# Patient Record
Sex: Male | Born: 1955 | Race: Asian | Hispanic: No
Health system: Southern US, Community
[De-identification: ages and names within clinical notes are randomized; demographics above are authoritative.]

## PROBLEM LIST (undated history)

## (undated) DIAGNOSIS — I10 Essential (primary) hypertension: Secondary | ICD-10-CM

## (undated) DIAGNOSIS — E785 Hyperlipidemia, unspecified: Secondary | ICD-10-CM

## (undated) HISTORY — PX: APPENDECTOMY: SHX54

## (undated) HISTORY — DX: Essential (primary) hypertension: I10

## (undated) HISTORY — DX: Hyperlipidemia, unspecified: E78.5

---

## 2017-12-25 ENCOUNTER — Emergency Department (HOSPITAL_COMMUNITY)
Admission: EM | Admit: 2017-12-25 | Discharge: 2017-12-25 | Disposition: A | Discharge status: Home / Self Care | Payer: BLUE CROSS/BLUE SHIELD | Attending: Emergency Medicine | Admitting: Emergency Medicine

## 2017-12-25 ENCOUNTER — Emergency Department (HOSPITAL_COMMUNITY): Payer: BLUE CROSS/BLUE SHIELD

## 2017-12-25 ENCOUNTER — Other Ambulatory Visit: Payer: Self-pay

## 2017-12-25 DIAGNOSIS — R1010 Upper abdominal pain, unspecified: Secondary | ICD-10-CM | POA: Diagnosis not present

## 2017-12-25 DIAGNOSIS — R079 Chest pain, unspecified: Secondary | ICD-10-CM | POA: Diagnosis not present

## 2017-12-25 LAB — CBC
HCT: 46 % (ref 39.0–52.0)
HEMOGLOBIN: 14 g/dL (ref 13.0–17.0)
MCH: 27.7 pg (ref 26.0–34.0)
MCHC: 30.4 g/dL (ref 30.0–36.0)
MCV: 90.9 fL (ref 80.0–100.0)
NRBC: 0 % (ref 0.0–0.2)
PLATELETS: 171 10*3/uL (ref 150–400)
RBC: 5.06 MIL/uL (ref 4.22–5.81)
RDW: 13.2 % (ref 11.5–15.5)
WBC: 7 10*3/uL (ref 4.0–10.5)

## 2017-12-25 LAB — HEPATIC FUNCTION PANEL
ALBUMIN: 4.1 g/dL (ref 3.5–5.0)
ALT: 54 U/L — ABNORMAL HIGH (ref 0–44)
AST: 37 U/L (ref 15–41)
Alkaline Phosphatase: 48 U/L (ref 38–126)
BILIRUBIN TOTAL: 0.8 mg/dL (ref 0.3–1.2)
Bilirubin, Direct: 0.2 mg/dL (ref 0.0–0.2)
Indirect Bilirubin: 0.6 mg/dL (ref 0.3–0.9)
Total Protein: 7.9 g/dL (ref 6.5–8.1)

## 2017-12-25 LAB — BASIC METABOLIC PANEL
Anion gap: 7 (ref 5–15)
BUN: 15 mg/dL (ref 8–23)
CALCIUM: 9.4 mg/dL (ref 8.9–10.3)
CO2: 27 mmol/L (ref 22–32)
Chloride: 102 mmol/L (ref 98–111)
Creatinine, Ser: 0.86 mg/dL (ref 0.61–1.24)
GLUCOSE: 122 mg/dL — AB (ref 70–99)
POTASSIUM: 3.5 mmol/L (ref 3.5–5.1)
Sodium: 136 mmol/L (ref 135–145)

## 2017-12-25 LAB — I-STAT TROPONIN, ED
TROPONIN I, POC: 0 ng/mL (ref 0.00–0.08)
TROPONIN I, POC: 0 ng/mL (ref 0.00–0.08)

## 2017-12-25 LAB — LIPASE, BLOOD: LIPASE: 29 U/L (ref 11–51)

## 2017-12-25 MED ORDER — ASPIRIN 81 MG PO CHEW
324.0000 mg | CHEWABLE_TABLET | Freq: Once | ORAL | Status: AC
  Administered 2017-12-25: 324 mg via ORAL
  Filled 2017-12-25: qty 4

## 2017-12-25 MED ORDER — SODIUM CHLORIDE 0.9 % IV BOLUS
1000.0000 mL | Freq: Once | INTRAVENOUS | Status: AC
  Administered 2017-12-25: 1000 mL via INTRAVENOUS

## 2017-12-25 MED ORDER — ALUM & MAG HYDROXIDE-SIMETH 200-200-20 MG/5ML PO SUSP
30.0000 mL | Freq: Once | ORAL | Status: AC
  Administered 2017-12-25: 30 mL via ORAL
  Filled 2017-12-25: qty 30

## 2017-12-25 MED ORDER — LIDOCAINE VISCOUS HCL 2 % MT SOLN
15.0000 mL | Freq: Once | OROMUCOSAL | Status: AC
  Administered 2017-12-25: 15 mL via ORAL
  Filled 2017-12-25: qty 15

## 2017-12-25 MED ORDER — PANTOPRAZOLE SODIUM 40 MG PO TBEC: 40.0000 mg | DELAYED_RELEASE_TABLET | Freq: Every day | ORAL | 0 refills | Status: DC

## 2017-12-25 NOTE — ED Provider Notes (Signed)
[facility] EMERGENCY DEPARTMENT Provider Note   CSN: [id number] Arrival date & time: 12/25/17  1151     History   Chief Complaint Chief Complaint  Patient presents with  . Chest Pain    HPI Skylur Fuston is a 62 y.o. male.  HPI  62 year old male with a history of hypertension presents with chest pain.  History is taken with the help of the Mandarin interpreter.  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  He had shortness of breath as well.  Nothing seemed to make it better or worse.  At one point the pain was about a 6 out of 10 but currently it is minimal and hard to describe.  Later on he developed some belching and upper abdominal discomfort that he also cannot describe.  The pain never radiated.  No back pain.  He did feel dizziness that is improved but not gone.  He does not smoke.  No known family history of heart disease.  He denies any other medical problems besides hypertension. Patient points to his inferior sternum as current site of minimal pain.  No past medical history on file.  There are no active problems to display for this patient.         Home Medications    Prior to Admission medications   Medication Sig Start Date End Date Taking? Authorizing Provider  pantoprazole (PROTONIX) 40 MG tablet Take 1 tablet (40 mg total) by mouth daily. 12/25/17   Pricilla Loveless, MD    Family History No family history on file.  Social History Social History   Tobacco Use  . Smoking status: Not on file  Substance Use Topics  . Alcohol use: Not on file  . Drug use: Not on file     Allergies   Other   Review of Systems Review of Systems  Respiratory: Positive for shortness of breath.   Cardiovascular: Positive for chest pain.  Gastrointestinal: Positive for abdominal pain. Negative for vomiting.  Neurological: Positive for dizziness and headaches  (mild).  All other systems reviewed and are negative.    Physical Exam Updated Vital Signs BP (!) 152/100   Pulse 78   Temp 98.6 F (37 C)   Resp 14   SpO2 100%   Physical Exam  Constitutional: He appears well-developed and well-nourished.  Non-toxic appearance. He does not appear ill. No distress.  HENT:  Head: Normocephalic and atraumatic.  Right Ear: External ear normal.  Left Ear: External ear normal.  Nose: Nose normal.  Eyes: Right eye exhibits no discharge. Left eye exhibits no discharge.  Neck: Neck supple.  Cardiovascular: Normal rate, regular rhythm and normal heart sounds.  Pulses:      Radial pulses are 2+ on the right side, and 2+ on the left side.       Dorsalis pedis pulses are 2+ on the right side, and 2+ on the left side.  Pulmonary/Chest: Effort normal and breath sounds normal. He exhibits no tenderness.  Abdominal: Soft. There is no tenderness.  Musculoskeletal: He exhibits no edema.  Neurological: He is alert.  Skin: Skin is warm and dry. He is not diaphoretic.  Psychiatric: His mood appears not anxious.  Nursing note and vitals reviewed.    ED Treatments / Results  Labs (all labs ordered are listed, but only abnormal results are displayed) Labs Reviewed  BASIC METABOLIC  PANEL - Abnormal; Notable for the following components:      Result Value   Glucose, Bld 122 (*)    All other components within normal limits  HEPATIC FUNCTION PANEL - Abnormal; Notable for the following components:   ALT 54 (*)    All other components within normal limits  CBC  LIPASE, BLOOD  I-STAT TROPONIN, ED  I-STAT TROPONIN, ED    EKG EKG Interpretation  Date/Time:  Saturday December 25 2017 11:58:08 EDT Ventricular Rate:  76 PR Interval:  164 QRS Duration: 98 QT Interval:  364 QTC Calculation: 409 R Axis:   45 Text Interpretation:  Normal sinus rhythm no acute ST/T changes No old tracing to compare Confirmed by Pricilla Loveless [phone]) on 12/25/2017 12:39:28  PM   EKG Interpretation  Date/Time:  Saturday December 25 2017 15:32:04 EDT Ventricular Rate:  65 PR Interval:  164 QRS Duration: 106 QT Interval:  399 QTC Calculation: 415 R Axis:   76 Text Interpretation:  Sinus rhythm Minimal ST elevation, anterior leads no significant change since earlier in the day Confirmed by Pricilla Loveless [phone]) on 12/25/2017 3:58:19 PM       Radiology Dg Chest 2 View  Result Date: 12/25/2017 CLINICAL DATA:  Here today for sudden onset of centralized chest pressure, 8/10 with dizziness and shaking. Pt states he feels weak all over. No unilateral weakness. He is alert and oriented. Speech and vision are normal but he reports having tr.*comment was truncated*is EXAM: CHEST - 2 VIEW COMPARISON:  None. FINDINGS: Normal mediastinum and cardiac silhouette. Normal pulmonary vasculature. No evidence of effusion, infiltrate, or pneumothorax. No acute bony abnormality. IMPRESSION: No acute cardiopulmonary process. Electronically Signed   By: Genevive Bi M.D.   On: 12/25/2017 12:50   US Abdomen Limited Ruq  Result Date: 12/25/2017 CLINICAL DATA:  Right upper abdominal pain EXAM: ULTRASOUND ABDOMEN LIMITED RIGHT UPPER QUADRANT COMPARISON:  None. FINDINGS: Gallbladder: No gallstones or wall thickening visualized. No sonographic Murphy sign noted by sonographer. Common bile duct: Diameter: 3.3 mm Liver: No focal lesion identified. Increased hepatic parenchymal echogenicity. Portal vein is patent on color Doppler imaging with normal direction of blood flow towards the liver. IMPRESSION: 1. No cholelithiasis or sonographic evidence of acute cholecystitis. 2. Increased hepatic echogenicity as can be seen with hepatic steatosis. Electronically Signed   By: Elige Ko   On: 12/25/2017 15:10    Procedures Procedures (including critical care time)  Medications Ordered in ED Medications  sodium chloride 0.9 % bolus 1,000 mL (0 mLs Intravenous Stopped 12/25/17 1634)  alum &  mag hydroxide-simeth (MAALOX/MYLANTA) 200-200-20 MG/5ML suspension 30 mL (30 mLs Oral Given 12/25/17 1406)    And  lidocaine (XYLOCAINE) 2 % viscous mouth solution 15 mL (15 mLs Oral Given 12/25/17 1404)  aspirin chewable tablet 324 mg (324 mg Oral Given 12/25/17 1406)     Initial Impression / Assessment and Plan / ED Course  I have reviewed the triage vital signs and the nursing notes.  Pertinent labs & imaging results that were available during my care of the patient were reviewed by me and considered in my medical decision making (see chart for details).     Patient's pain is probably more abdominal than chest.  However it is in a middle zone.  My suspicion for dissection is pretty low, especially given good pulses diffusely and no hypertension on exam.  Pain has significantly improved and then resolved with Maalox.  Right upper quadrant ultrasound without obvious gallbladder issues.  Labs are reassuring.  Troponins are negative x2.  ECG without acute ischemic changes.  His heart score is a 2.  My suspicion for ACS, PE, dissection is pretty low.  I have discussed plan through the Mandarin interpreter and he will follow-up with his PCP.  We discussed that we have not fully ruled out any heart disease at all and we discussed return precautions.  Final Clinical Impressions(s) / ED Diagnoses   Final diagnoses:  Upper abdominal pain  Nonspecific chest pain    ED Discharge Orders         Ordered    pantoprazole (PROTONIX) 40 MG tablet  Daily     12/25/17 1743           Pricilla Loveless, MD 12/25/17 2140

## 2017-12-25 NOTE — ED Notes (Signed)
Patient verbalizes understanding of discharge instructions. Opportunity for questioning and answers were provided. Armband removed by staff, pt discharged from ED.  

## 2017-12-25 NOTE — Discharge Instructions (Signed)
Your pain is probably coming from a stomach acid problem.  If you develop recurrent or worsening chest pain, abdominal pain, vomiting or vomiting blood, black or bloody stools, or any other new/concerning symptoms and return to the ER for evaluation.  We are not seeing any evidence of heart attack today.  However that does not rule out all heart disease and he will need to follow-up with your primary care physician in a couple days.  ????????????????????????????????????????????????????/????????????????  ????????????????????????????????????????????????????? Nn de tngtng k?nng liz wisu?n wnt. Rgu? nn ch?xin f?nf f?zu hu ji?zhng de xi?ngtng, ftng, ?ut hu ?uxi?, dbin f? h?i hu lixu hu rnh qt? x?n de/y?ugu?n zhngzhung, q?ng f?nhu jzh?n sh jnxng pngg?.  J?nti?n w?men miy?u kn do rnh x?nzng bng de zhngj. Dnsh, zh bng b pich su?y?u x?nzng bng, t? ji?ng x?yo zi j? ti?n ni y? nn de ch?j b?ojin y?sh?ng jnxng suf?ng.

## 2017-12-25 NOTE — ED Notes (Signed)
Patient transported to Ultrasound 

## 2017-12-25 NOTE — ED Triage Notes (Signed)
[place] interpreter used. Pt has hx of HTN. Here today for sudden onset of centralized chest pressure, 8/10 with dizziness. Pt states he feels weak all over. No unilateral weakness. He is alert and oriented. Speech and vision are normal but he reports having trouble with his balance.

## 2019-03-18 IMAGING — US US ABDOMEN LIMITED
1 series · 14 of 25 positions shown · non-contrast
Comparison: None.

CLINICAL DATA: Right upper abdominal pain

EXAM:
ULTRASOUND ABDOMEN LIMITED RIGHT UPPER QUADRANT

[Series 1: us abdomen limited · 0.22mm/px · 14 of 27 slices shown]
[im 1/27]
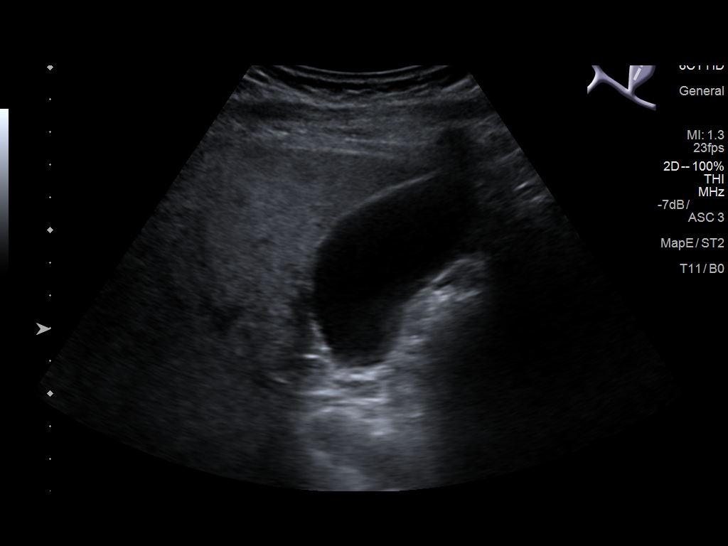
[im 3/27]
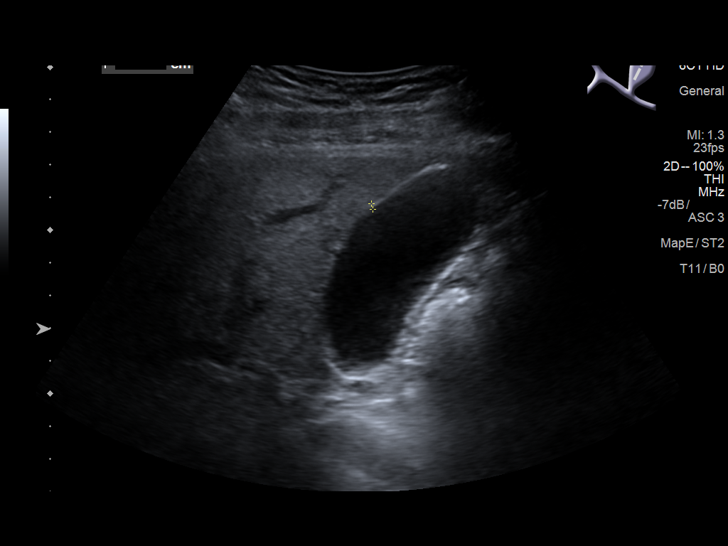
[im 5/27]
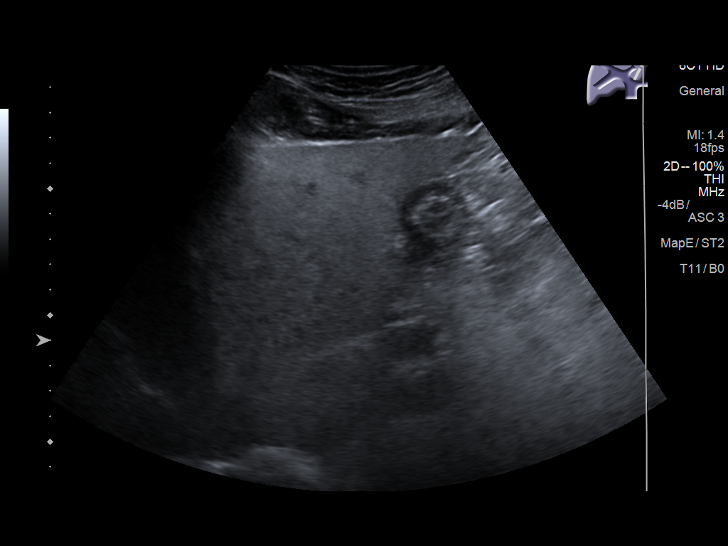
[im 7/27]
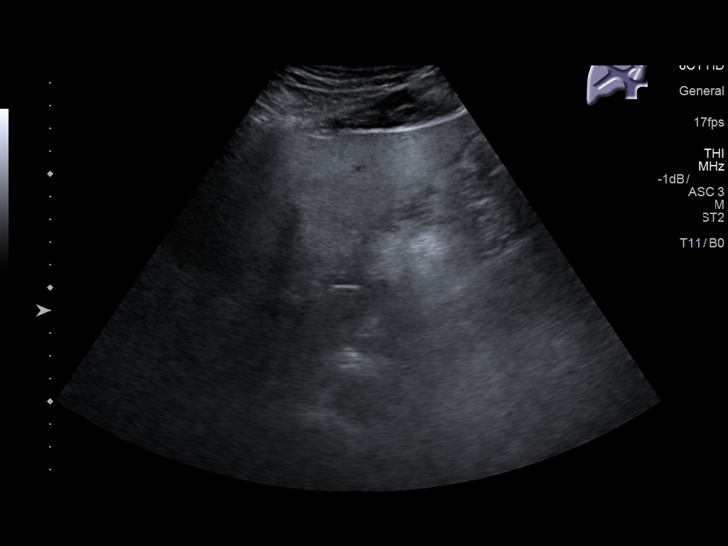
[im 9/27]
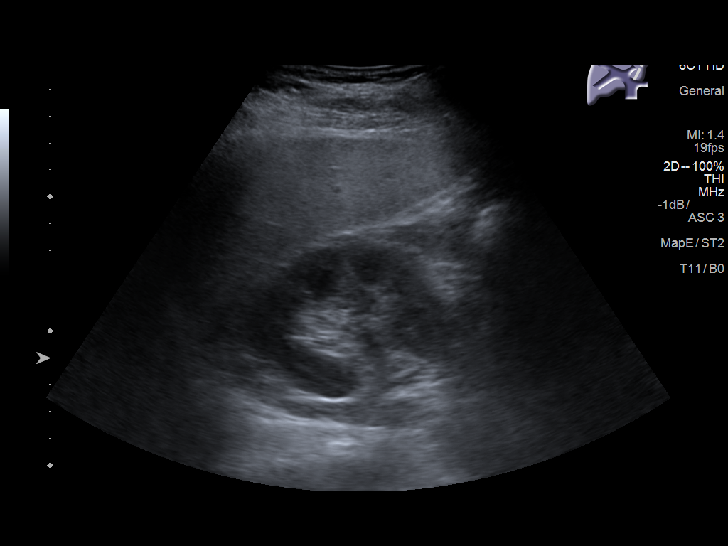
[im 10/27]
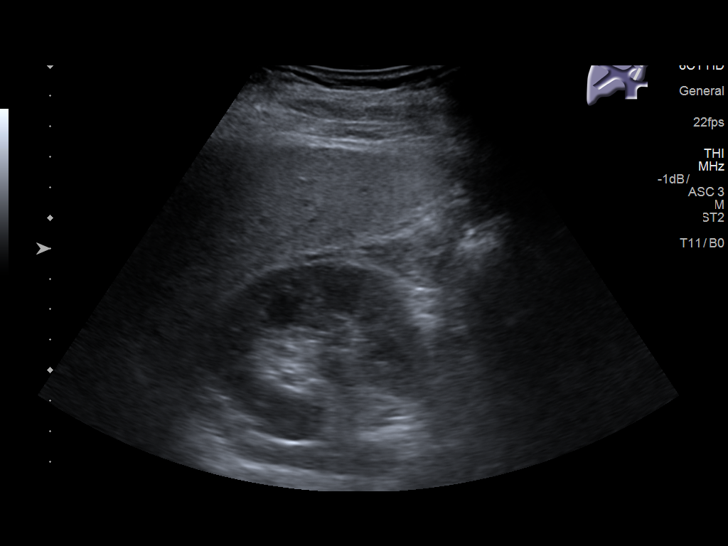
[im 12/27]
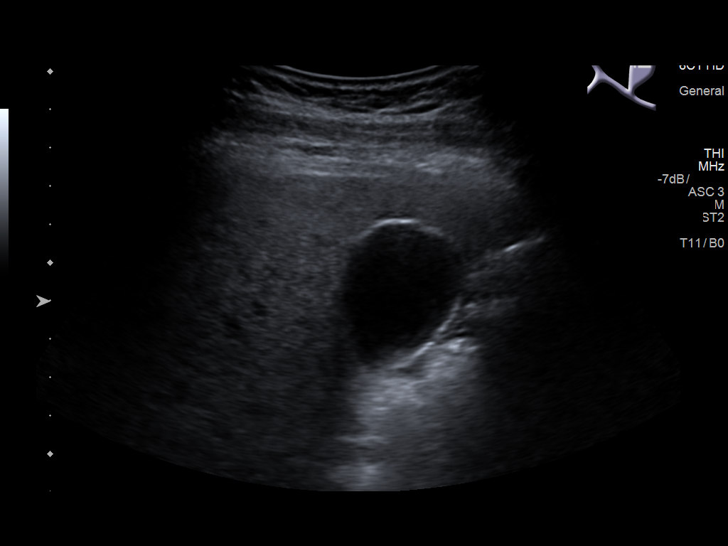
[im 15/27]
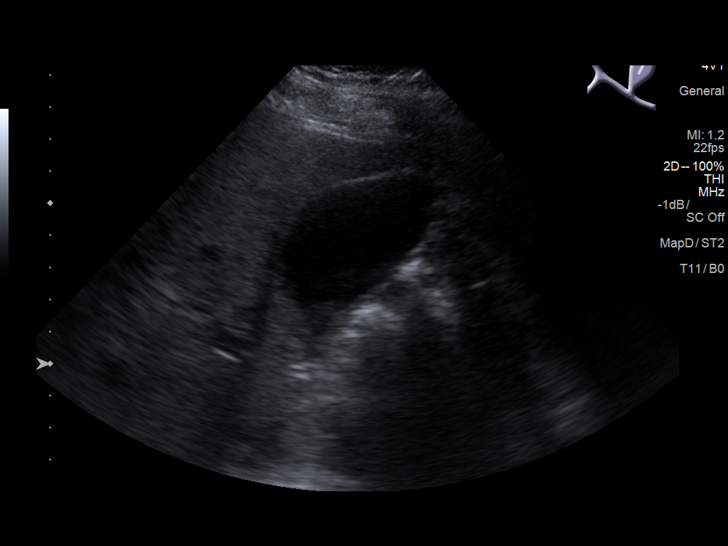
[im 17/27]
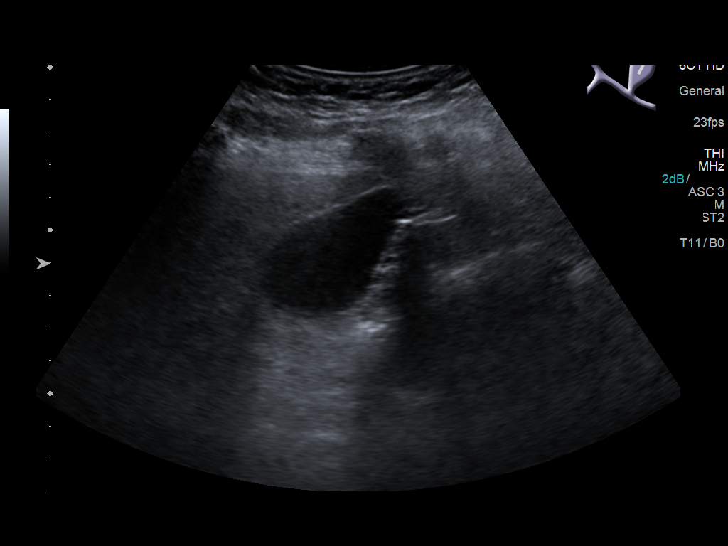
[im 18/27]
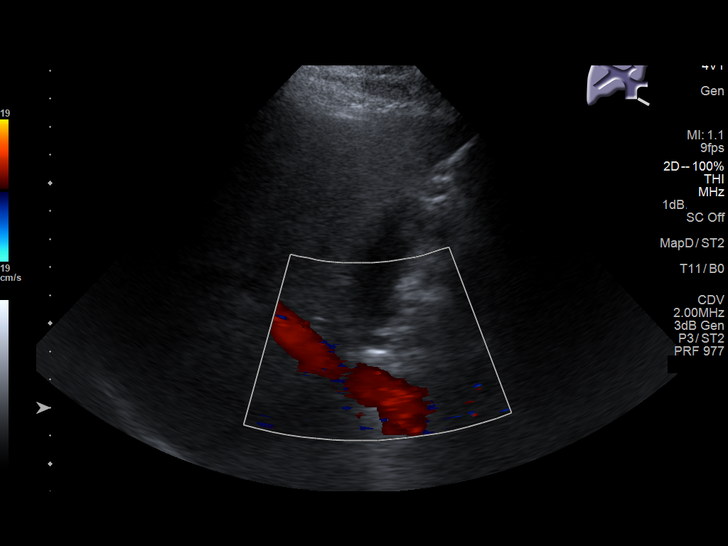
[im 20/27]
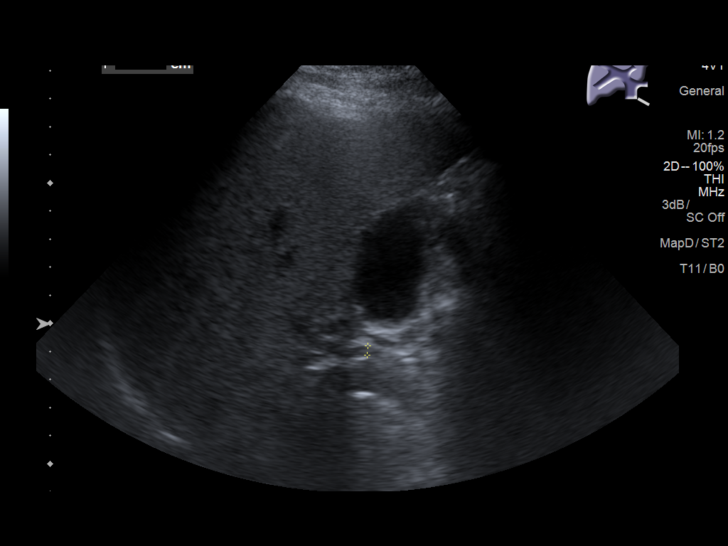
[im 22/27]
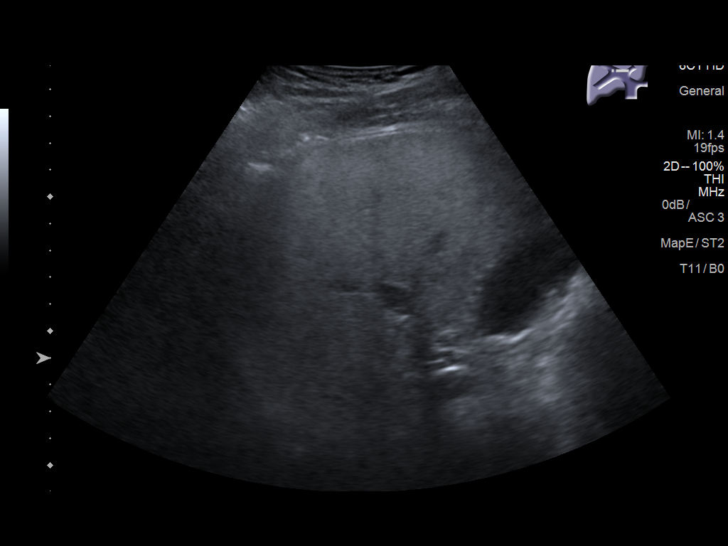
[im 24/27]
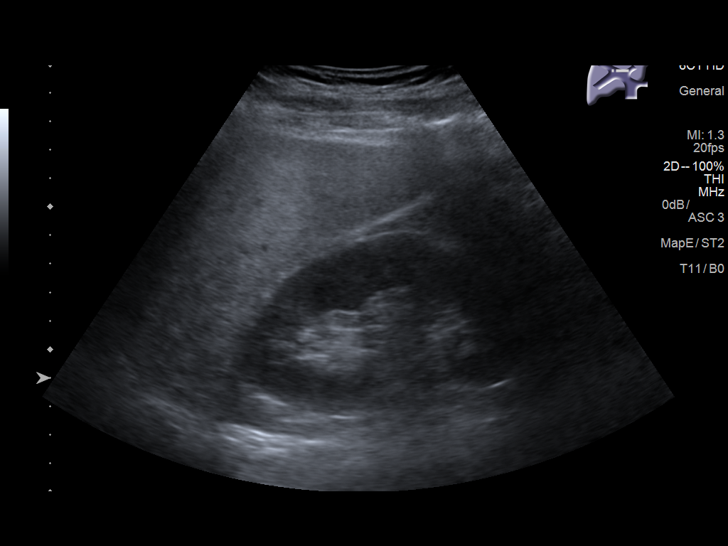
[im 27/27]
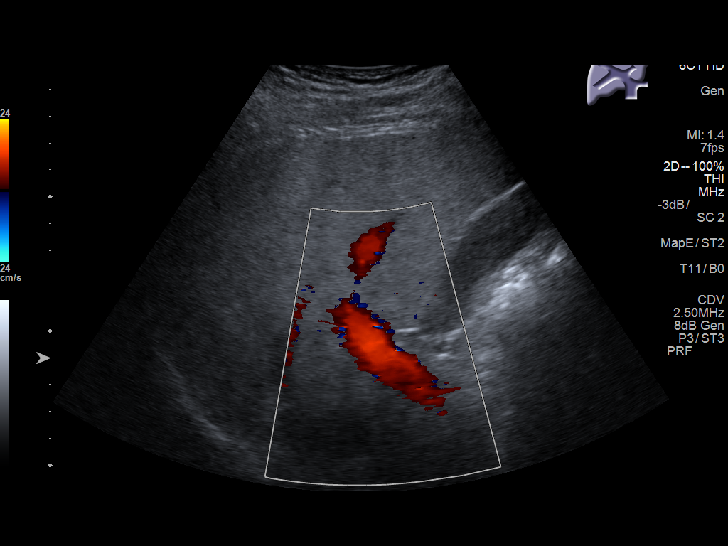

[14 of 25 positions shown; findings below may reference images not displayed]

FINDINGS: Gallbladder:

No gallstones or wall thickening visualized. No sonographic Murphy
sign noted by sonographer.

Common bile duct:

Diameter: 3.3 mm

Liver:

No focal lesion identified. Increased hepatic parenchymal
echogenicity. Portal vein is patent on color Doppler imaging with
normal direction of blood flow towards the liver.
IMPRESSION: 1. No cholelithiasis or sonographic evidence of acute cholecystitis.
2. Increased hepatic echogenicity as can be seen with hepatic
steatosis.

## 2019-10-03 ENCOUNTER — Encounter: Payer: Self-pay | Admitting: Emergency Medicine

## 2019-10-03 ENCOUNTER — Ambulatory Visit (INDEPENDENT_AMBULATORY_CARE_PROVIDER_SITE_OTHER): Payer: 59 | Admitting: Emergency Medicine

## 2019-10-03 VITALS — BP 143/85 | HR 88 | Temp 97.7°F | Ht 71.0 in | Wt 218.0 lb

## 2019-10-03 DIAGNOSIS — Z1159 Encounter for screening for other viral diseases: Secondary | ICD-10-CM

## 2019-10-03 DIAGNOSIS — Z7689 Persons encountering health services in other specified circumstances: Secondary | ICD-10-CM

## 2019-10-03 DIAGNOSIS — Z114 Encounter for screening for human immunodeficiency virus [HIV]: Secondary | ICD-10-CM

## 2019-10-03 DIAGNOSIS — I10 Essential (primary) hypertension: Secondary | ICD-10-CM | POA: Diagnosis not present

## 2019-10-03 DIAGNOSIS — Z1211 Encounter for screening for malignant neoplasm of colon: Secondary | ICD-10-CM

## 2019-10-03 MED ORDER — CLONIDINE 0.3 MG/24HR TD PTWK: 0.3000 mg | MEDICATED_PATCH | TRANSDERMAL | 12 refills | Status: DC

## 2019-10-03 NOTE — Patient Instructions (Addendum)
   If you have lab work done today you will be contacted with your lab results within the next 2 weeks.  If you have not heard from us then please contact us. The fastest way to get your results is to register for My Chart.   IF you received an x-ray today, you will receive an invoice from [facility]. Please contact  Radiology at [phone] with questions or concerns regarding your invoice.   IF you received labwork today, you will receive an invoice from [facility]. Please contact [facility] at [phone] with questions or concerns regarding your invoice.   Our billing staff will not be able to assist you with questions regarding bills from these companies.  You will be contacted with the lab results as soon as they are available. The fastest way to get your results is to activate your My Chart account. Instructions are located on the last page of this paperwork. If you have not heard from us regarding the results in 2 weeks, please contact this office.      Hypertension, Adult Hypertension is another name for high blood pressure. High blood pressure forces your heart to work harder to pump blood. This can cause problems over time. There are two numbers in a blood pressure reading. There is a top number (systolic) over a bottom number (diastolic). It is best to have a blood pressure that is below 120/80. Healthy choices can help lower your blood pressure, or you may need medicine to help lower it. What are the causes? The cause of this condition is not known. Some conditions may be related to high blood pressure. What increases the risk?  Smoking.  Having type 2 diabetes mellitus, high cholesterol, or both.  Not getting enough exercise or physical activity.  Being overweight.  Having too much fat, sugar, calories, or salt (sodium) in your diet.  Drinking too much alcohol.  Having long-term (chronic) kidney disease.  Having a family history of high blood  pressure.  Age. Risk increases with age.  Race. You may be at higher risk if you are African American.  Gender. Men are at higher risk than women before age 45. After age 65, women are at higher risk than men.  Having obstructive sleep apnea.  Stress. What are the signs or symptoms?  High blood pressure may not cause symptoms. Very high blood pressure (hypertensive crisis) may cause: ? Headache. ? Feelings of worry or nervousness (anxiety). ? Shortness of breath. ? Nosebleed. ? A feeling of being sick to your stomach (nausea). ? Throwing up (vomiting). ? Changes in how you see. ? Very bad chest pain. ? Seizures. How is this treated?  This condition is treated by making healthy lifestyle changes, such as: ? Eating healthy foods. ? Exercising more. ? Drinking less alcohol.  Your health care provider may prescribe medicine if lifestyle changes are not enough to get your blood pressure under control, and if: ? Your top number is above 130. ? Your bottom number is above 80.  Your personal target blood pressure may vary. Follow these instructions at home: Eating and drinking   If told, follow the DASH eating plan. To follow this plan: ? Fill one half of your plate at each meal with fruits and vegetables. ? Fill one fourth of your plate at each meal with whole grains. Whole grains include whole-wheat pasta, brown rice, and whole-grain bread. ? Eat or drink low-fat dairy products, such as skim milk or low-fat yogurt. ? Fill   one fourth of your plate at each meal with low-fat (lean) proteins. Low-fat proteins include fish, chicken without skin, eggs, beans, and tofu. ? Avoid fatty meat, cured and processed meat, or chicken with skin. ? Avoid pre-made or processed food.  Eat less than 1,500 mg of salt each day.  Do not drink alcohol if: ? Your doctor tells you not to drink. ? You are pregnant, may be pregnant, or are planning to become pregnant.  If you drink  alcohol: ? Limit how much you use to:  0-1 drink a day for women.  0-2 drinks a day for men. ? Be aware of how much alcohol is in your drink. In the [place] one drink equals one 12 oz bottle of beer (355 mL), one 5 oz glass of wine (148 mL), or one 1 oz glass of hard liquor (44 mL). Lifestyle   Work with your doctor to stay at a healthy weight or to lose weight. Ask your doctor what the best weight is for you.  Get at least 30 minutes of exercise most days of the week. This may include walking, swimming, or biking.  Get at least 30 minutes of exercise that strengthens your muscles (resistance exercise) at least 3 days a week. This may include lifting weights or doing Pilates.  Do not use any products that contain nicotine or tobacco, such as cigarettes, e-cigarettes, and chewing tobacco. If you need help quitting, ask your doctor.  Check your blood pressure at home as told by your doctor.  Keep all follow-up visits as told by your doctor. This is important. Medicines  Take over-the-counter and prescription medicines only as told by your doctor. Follow directions carefully.  Do not skip doses of blood pressure medicine. The medicine does not work as well if you skip doses. Skipping doses also puts you at risk for problems.  Ask your doctor about side effects or reactions to medicines that you should watch for. Contact a doctor if you:  Think you are having a reaction to the medicine you are taking.  Have headaches that keep coming back (recurring).  Feel dizzy.  Have swelling in your ankles.  Have trouble with your vision. Get help right away if you:  Get a very bad headache.  Start to feel mixed up (confused).  Feel weak or numb.  Feel faint.  Have very bad pain in your: ? Chest. ? Belly (abdomen).  Throw up more than once.  Have trouble breathing. Summary  Hypertension is another name for high blood pressure.  High blood pressure forces your heart to work  harder to pump blood.  For most people, a normal blood pressure is less than 120/80.  Making healthy choices can help lower blood pressure. If your blood pressure does not get lower with healthy choices, you may need to take medicine. This information is not intended to replace advice given to you by your health care provider. Make sure you discuss any questions you have with your health care provider. Document Revised: 10/20/2017 Document Reviewed: 10/20/2017 Elsevier Patient Education  2020 [employer]  

## 2019-10-03 NOTE — Progress Notes (Signed)
Brent Jensen 64 y.o.   Chief Complaint  Patient presents with  . New Patient (Initial Visit)    HISTORY OF PRESENT ILLNESS: This is a 64 y.o. male first visit to this office here to establish care with me. Has history of hypertension and has been using clonidine weekly patches for the past year. No other significant past medical history. Congohinese interpreter used.  Here with his wife. Has no complaints or medical concerns today.  HPI   Prior to Admission medications   Medication Sig Start Date End Date Taking? Authorizing Provider  pantoprazole (PROTONIX) 40 MG tablet Take 1 tablet (40 mg total) by mouth daily. 12/25/17   Pricilla LovelessGoldston, Scott, MD    Allergies  Allergen Reactions  . Other     A blood pressure medication but he does not know which one.     There are no problems to display for this patient.   History reviewed. No pertinent past medical history.  History reviewed. No pertinent surgical history.  Social History   Socioeconomic History  . Marital status: Married    Spouse name: Not on file  . Number of children: Not on file  . Years of education: Not on file  . Highest education level: Not on file  Occupational History  . Not on file  Tobacco Use  . Smoking status: Not on file  Substance and Sexual Activity  . Alcohol use: Not on file  . Drug use: Not on file  . Sexual activity: Not on file  Other Topics Concern  . Not on file  Social History Narrative  . Not on file   Social Determinants of Health   Financial Resource Strain:   . Difficulty of Paying Living Expenses:   Food Insecurity:   . Worried About Programme researcher, broadcasting/film/videounning Out of Food in the Last Year:   . Baristaan Out of Food in the Last Year:   Transportation Needs:   . Freight forwarderLack of Transportation (Medical):   Marland Kitchen. Lack of Transportation (Non-Medical):   Physical Activity:   . Days of Exercise per Week:   . Minutes of Exercise per Session:   Stress:   . Feeling of Stress :   Social Connections:   . Frequency of  Communication with Friends and Family:   . Frequency of Social Gatherings with Friends and Family:   . Attends Religious Services:   . Active Member of Clubs or Organizations:   . Attends BankerClub or Organization Meetings:   Marland Kitchen. Marital Status:   Intimate Partner Violence:   . Fear of Current or Ex-Partner:   . Emotionally Abused:   Marland Kitchen. Physically Abused:   . Sexually Abused:     History reviewed. No pertinent family history.   Review of Systems  Constitutional: Negative.  Negative for chills and fever.  HENT: Negative.  Negative for congestion and sore throat.   Respiratory: Negative.  Negative for cough and shortness of breath.   Cardiovascular: Negative.  Negative for chest pain and palpitations.  Gastrointestinal: Negative.  Negative for abdominal pain, diarrhea, nausea and vomiting.  Genitourinary: Negative.  Negative for dysuria and hematuria.  Musculoskeletal: Negative.  Negative for myalgias and neck pain.  Skin: Negative.  Negative for rash.  Neurological: Negative.  Negative for dizziness and headaches.  Endo/Heme/Allergies: Negative.   All other systems reviewed and are negative.  Today's Vitals   10/03/19 1337 10/03/19 1344  BP: (!) 146/84 (!) 143/85  Pulse: 88   Temp: 97.7 F (36.5 C)   TempSrc: Temporal  SpO2: 95%   Weight: 218 lb (98.9 kg)   Height: 5\' 11"  (1.803 m)    Body mass index is 30.4 kg/m.   Physical Exam Vitals reviewed.  Constitutional:      Appearance: Normal appearance.  HENT:     Head: Normocephalic.  Eyes:     Extraocular Movements: Extraocular movements intact.     Conjunctiva/sclera: Conjunctivae normal.     Pupils: Pupils are equal, round, and reactive to light.  Cardiovascular:     Rate and Rhythm: Normal rate and regular rhythm.     Pulses: Normal pulses.     Heart sounds: Normal heart sounds.  Pulmonary:     Effort: Pulmonary effort is normal.     Breath sounds: Normal breath sounds.  Abdominal:     Palpations: Abdomen is  soft.     Tenderness: There is no abdominal tenderness.  Musculoskeletal:        General: Normal range of motion.     Cervical back: Normal range of motion and neck supple. No tenderness.  Lymphadenopathy:     Cervical: No cervical adenopathy.  Skin:    General: Skin is warm and dry.     Capillary Refill: Capillary refill takes less than 2 seconds.  Neurological:     General: No focal deficit present.     Mental Status: He is alert and oriented to person, place, and time.  Psychiatric:        Mood and Affect: Mood normal.        Behavior: Behavior normal.      ASSESSMENT & PLAN: Brent Jensen was seen today for new patient (initial visit).  Diagnoses and all orders for this visit:  Essential hypertension -     CBC -     Lipid panel -     TSH -     Comprehensive metabolic panel -     cloNIDine (CATAPRES - DOSED IN MG/24 HR) 0.3 mg/24hr patch; Place 1 patch (0.3 mg total) onto the skin once a week.  Encounter to establish care  Screening for colon cancer -     Ambulatory referral to Gastroenterology  Need for hepatitis C screening test -     Hepatitis C antibody  Screening for HIV (human immunodeficiency virus) -     HIV antibody (with reflex)  Other orders -     Tdap vaccine greater than or equal to 7yo IM    Patient Instructions       If you have lab work done today you will be contacted with your lab results within the next 2 weeks.  If you have not heard from Brent Jensen then please contact us. The fastest way to get your results is to register for My Chart.   IF you received an x-ray today, you will receive an invoice from [facility]. Please contact [facility] at [phone] with questions or concerns regarding your invoice.   IF you received labwork today, you will receive an invoice from [facility]. Please contact [facility] at [phone] with questions or concerns regarding your invoice.   Our billing staff will not be able to assist you with  questions regarding bills from these companies.  You will be contacted with the lab results as soon as they are available. The fastest way to get your results is to activate your My Chart account. Instructions are located on the last page of this paperwork. If you have not heard from [phone] regarding the results in 2 weeks, please contact this  office.     Hypertension, Adult Hypertension is another name for high blood pressure. High blood pressure forces your heart to work harder to pump blood. This can cause problems over time. There are two numbers in a blood pressure reading. There is a top number (systolic) over a bottom number (diastolic). It is best to have a blood pressure that is below 120/80. Healthy choices can help lower your blood pressure, or you may need medicine to help lower it. What are the causes? The cause of this condition is not known. Some conditions may be related to high blood pressure. What increases the risk?  Smoking.  Having type 2 diabetes mellitus, high cholesterol, or both.  Not getting enough exercise or physical activity.  Being overweight.  Having too much fat, sugar, calories, or salt (sodium) in your diet.  Drinking too much alcohol.  Having long-term (chronic) kidney disease.  Having a family history of high blood pressure.  Age. Risk increases with age.  Race. You may be at higher risk if you are African American.  Gender. Men are at higher risk than women before age 14. After age 45, women are at higher risk than men.  Having obstructive sleep apnea.  Stress. What are the signs or symptoms?  High blood pressure may not cause symptoms. Very high blood pressure (hypertensive crisis) may cause: ? Headache. ? Feelings of worry or nervousness (anxiety). ? Shortness of breath. ? Nosebleed. ? A feeling of being sick to your stomach (nausea). ? Throwing up (vomiting). ? Changes in how you see. ? Very bad chest pain. ? Seizures. How is this  treated?  This condition is treated by making healthy lifestyle changes, such as: ? Eating healthy foods. ? Exercising more. ? Drinking less alcohol.  Your health care provider may prescribe medicine if lifestyle changes are not enough to get your blood pressure under control, and if: ? Your top number is above 130. ? Your bottom number is above 80.  Your personal target blood pressure may vary. Follow these instructions at home: Eating and drinking   If told, follow the DASH eating plan. To follow this plan: ? Fill one half of your plate at each meal with fruits and vegetables. ? Fill one fourth of your plate at each meal with whole grains. Whole grains include whole-wheat pasta, brown rice, and whole-grain bread. ? Eat or drink low-fat dairy products, such as skim milk or low-fat yogurt. ? Fill one fourth of your plate at each meal with low-fat (lean) proteins. Low-fat proteins include fish, chicken without skin, eggs, beans, and tofu. ? Avoid fatty meat, cured and processed meat, or chicken with skin. ? Avoid pre-made or processed food.  Eat less than 1,500 mg of salt each day.  Do not drink alcohol if: ? Your doctor tells you not to drink. ? You are pregnant, may be pregnant, or are planning to become pregnant.  If you drink alcohol: ? Limit how much you use to:  0-1 drink a day for women.  0-2 drinks a day for men. ? Be aware of how much alcohol is in your drink. In the [place] one drink equals one 12 oz bottle of beer (355 mL), one 5 oz glass of wine (148 mL), or one 1 oz glass of hard liquor (44 mL). Lifestyle   Work with your doctor to stay at a healthy weight or to lose weight. Ask your doctor what the best weight is for you.  Get at least  30 minutes of exercise most days of the week. This may include walking, swimming, or biking.  Get at least 30 minutes of exercise that strengthens your muscles (resistance exercise) at least 3 days a week. This may include  lifting weights or doing Pilates.  Do not use any products that contain nicotine or tobacco, such as cigarettes, e-cigarettes, and chewing tobacco. If you need help quitting, ask your doctor.  Check your blood pressure at home as told by your doctor.  Keep all follow-up visits as told by your doctor. This is important. Medicines  Take over-the-counter and prescription medicines only as told by your doctor. Follow directions carefully.  Do not skip doses of blood pressure medicine. The medicine does not work as well if you skip doses. Skipping doses also puts you at risk for problems.  Ask your doctor about side effects or reactions to medicines that you should watch for. Contact a doctor if you:  Think you are having a reaction to the medicine you are taking.  Have headaches that keep coming back (recurring).  Feel dizzy.  Have swelling in your ankles.  Have trouble with your vision. Get help right away if you:  Get a very bad headache.  Start to feel mixed up (confused).  Feel weak or numb.  Feel faint.  Have very bad pain in your: ? Chest. ? Belly (abdomen).  Throw up more than once.  Have trouble breathing. Summary  Hypertension is another name for high blood pressure.  High blood pressure forces your heart to work harder to pump blood.  For most people, a normal blood pressure is less than 120/80.  Making healthy choices can help lower blood pressure. If your blood pressure does not get lower with healthy choices, you may need to take medicine. This information is not intended to replace advice given to you by your health care provider. Make sure you discuss any questions you have with your health care provider. Document Revised: 10/20/2017 Document Reviewed: 10/20/2017 Elsevier Patient Education  2020 [employer]      Edwina Barth, MD [facility]

## 2019-10-04 LAB — CBC
Hematocrit: 45.5 % (ref 37.5–51.0)
Hemoglobin: 14.6 g/dL (ref 13.0–17.7)
MCH: 28.5 pg (ref 26.6–33.0)
MCHC: 32.1 g/dL (ref 31.5–35.7)
MCV: 89 fL (ref 79–97)
Platelets: 167 10*3/uL (ref 150–450)
RBC: 5.12 x10E6/uL (ref 4.14–5.80)
RDW: 13.1 % (ref 11.6–15.4)
WBC: 6.5 10*3/uL (ref 3.4–10.8)

## 2019-10-04 LAB — HEPATITIS C ANTIBODY: Hep C Virus Ab: <0.1 s/co ratio (ref 0.0–0.9)

## 2019-10-04 LAB — TSH: TSH: 1.86 u[IU]/mL (ref 0.450–4.500)

## 2019-10-04 LAB — LIPID PANEL
Chol/HDL Ratio: 5.9 ratio — ABNORMAL HIGH (ref 0.0–5.0)
Cholesterol, Total: 207 mg/dL — ABNORMAL HIGH (ref 100–199)
HDL: 35 mg/dL — ABNORMAL LOW (ref >39)
LDL Chol Calc (NIH): 88 mg/dL (ref 0–99)
Triglycerides: 517 mg/dL — ABNORMAL HIGH (ref 0–149)
VLDL Cholesterol Cal: 84 mg/dL — ABNORMAL HIGH (ref 5–40)

## 2019-10-04 LAB — COMPREHENSIVE METABOLIC PANEL
ALT: 68 IU/L — ABNORMAL HIGH (ref 0–44)
AST: 42 IU/L — ABNORMAL HIGH (ref 0–40)
Albumin/Globulin Ratio: 1.4 (ref 1.2–2.2)
Albumin: 4.7 g/dL (ref 3.8–4.8)
Alkaline Phosphatase: 68 IU/L (ref 48–121)
BUN/Creatinine Ratio: 30 — ABNORMAL HIGH (ref 10–24)
BUN: 21 mg/dL (ref 8–27)
Bilirubin Total: 0.4 mg/dL (ref 0.0–1.2)
CO2: 24 mmol/L (ref 20–29)
Calcium: 9.6 mg/dL (ref 8.6–10.2)
Chloride: 97 mmol/L (ref 96–106)
Creatinine, Ser: 0.69 mg/dL — ABNORMAL LOW (ref 0.76–1.27)
GFR calc Af Amer: 117 mL/min/{1.73_m2} (ref >59)
GFR calc non Af Amer: 101 mL/min/{1.73_m2} (ref >59)
Globulin, Total: 3.3 g/dL (ref 1.5–4.5)
Glucose: 106 mg/dL — ABNORMAL HIGH (ref 65–99)
Potassium: 3.9 mmol/L (ref 3.5–5.2)
Sodium: 136 mmol/L (ref 134–144)
Total Protein: 8 g/dL (ref 6.0–8.5)

## 2019-10-04 LAB — HIV ANTIBODY (ROUTINE TESTING W REFLEX): HIV Screen 4th Generation wRfx: NONREACTIVE

## 2019-10-09 ENCOUNTER — Encounter: Payer: Self-pay | Admitting: Radiology

## 2019-10-10 ENCOUNTER — Other Ambulatory Visit: Payer: Self-pay

## 2019-10-10 DIAGNOSIS — I10 Essential (primary) hypertension: Secondary | ICD-10-CM

## 2019-10-10 MED ORDER — ATORVASTATIN CALCIUM 10 MG PO TABS: 10.0000 mg | ORAL_TABLET | Freq: Every day | ORAL | 3 refills | Status: DC

## 2019-12-05 ENCOUNTER — Ambulatory Visit: Payer: 59 | Admitting: Emergency Medicine

## 2019-12-05 ENCOUNTER — Telehealth: Payer: Self-pay

## 2019-12-05 DIAGNOSIS — I10 Essential (primary) hypertension: Secondary | ICD-10-CM

## 2019-12-05 MED ORDER — CLONIDINE 0.3 MG/24HR TD PTWK: 0.3000 mg | MEDICATED_PATCH | TRANSDERMAL | 12 refills | Status: AC

## 2019-12-05 MED ORDER — ATORVASTATIN CALCIUM 10 MG PO TABS: 10.0000 mg | ORAL_TABLET | Freq: Every day | ORAL | 3 refills | Status: AC

## 2019-12-05 NOTE — Telephone Encounter (Signed)
Patient came for refill

## 2020-04-02 ENCOUNTER — Ambulatory Visit: Payer: 59 | Admitting: Emergency Medicine

## 2020-04-03 ENCOUNTER — Encounter: Payer: Self-pay | Admitting: Emergency Medicine

## 2020-11-29 ENCOUNTER — Other Ambulatory Visit: Payer: Self-pay | Admitting: Emergency Medicine

## 2020-11-29 DIAGNOSIS — I10 Essential (primary) hypertension: Secondary | ICD-10-CM

## 2020-12-04 ENCOUNTER — Other Ambulatory Visit: Payer: Self-pay | Admitting: Emergency Medicine

## 2020-12-04 DIAGNOSIS — I10 Essential (primary) hypertension: Secondary | ICD-10-CM
